# Patient Record
Sex: Female | Born: 1980 | Race: Black or African American | Hispanic: No | Marital: Married | Smoking: Never smoker
Health system: Southern US, Community
[De-identification: ages and names within clinical notes are randomized; demographics above are authoritative.]

## PROBLEM LIST (undated history)

## (undated) DIAGNOSIS — I10 Essential (primary) hypertension: Secondary | ICD-10-CM

## (undated) HISTORY — PX: TUBAL LIGATION: SHX77

---

## 2011-12-13 ENCOUNTER — Encounter (HOSPITAL_BASED_OUTPATIENT_CLINIC_OR_DEPARTMENT_OTHER): Payer: Self-pay | Admitting: *Deleted

## 2011-12-13 ENCOUNTER — Emergency Department (HOSPITAL_BASED_OUTPATIENT_CLINIC_OR_DEPARTMENT_OTHER): Payer: Worker's Compensation

## 2011-12-13 ENCOUNTER — Emergency Department (HOSPITAL_BASED_OUTPATIENT_CLINIC_OR_DEPARTMENT_OTHER)
Admission: EM | Admit: 2011-12-13 | Discharge: 2011-12-13 | Disposition: A | Discharge status: Home / Self Care | Payer: Worker's Compensation | Attending: Emergency Medicine | Admitting: Emergency Medicine

## 2011-12-13 DIAGNOSIS — W19XXXA Unspecified fall, initial encounter: Secondary | ICD-10-CM

## 2011-12-13 DIAGNOSIS — S20229A Contusion of unspecified back wall of thorax, initial encounter: Secondary | ICD-10-CM

## 2011-12-13 DIAGNOSIS — Y9269 Other specified industrial and construction area as the place of occurrence of the external cause: Secondary | ICD-10-CM | POA: Insufficient documentation

## 2011-12-13 LAB — URINALYSIS, ROUTINE W REFLEX MICROSCOPIC
Bilirubin Urine: NEGATIVE
Ketones, ur: NEGATIVE mg/dL
Nitrite: NEGATIVE
Protein, ur: NEGATIVE mg/dL
Urobilinogen, UA: 0.2 mg/dL (ref 0.0–1.0)

## 2011-12-13 MED ORDER — IBUPROFEN 800 MG PO TABS
800.0000 mg | ORAL_TABLET | Freq: Once | ORAL | Status: AC
  Administered 2011-12-13: 800 mg via ORAL
  Filled 2011-12-13: qty 1

## 2011-12-13 NOTE — ED Notes (Signed)
Pt amb to room 8 with quick steady gait smiling in nad. Pt reports fall at work yesterday, and states she has been awake all night with mid left sided back pain. Pt states she does not want worker's comp to cover this visit and she declines to phone her supervisor to ask if she needs a UDS and/or BAT.  Pt encouraged to phone her supervisor to inquire.

## 2011-12-13 NOTE — ED Provider Notes (Signed)
History     CSN: [id number]  Arrival date & time 12/13/11  0857   First MD Initiated Contact with Patient 12/13/11 [id number]      Chief Complaint  Patient presents with  . Fall  . Back Pain    (Consider location/radiation/quality/duration/timing/severity/associated sxs/prior treatment) HPI Pt presents with pain in left thoracic cage.  She states that she was at work yesterday and fell.  She states she was holding a large picture frame and fell on her left side onto some large picture frames that were leaning up against a table.  No difficulty breathing.  Pain is worse with movement and palpation.  She has tried tylenol without much relief.  No weakness of legs, no urinary retention, no incontinence of bowel or bladder.  There are no other associated systemic symptoms, there are no other alleviating or modifying factors.  Denies neck pain  History reviewed. No pertinent past medical history.  History reviewed. No pertinent past surgical history.  History reviewed. No pertinent family history.  History  Substance Use Topics  . Smoking status: Not on file  . Smokeless tobacco: Not on file  . Alcohol Use: Not on file    OB History    Grav Para Term Preterm Abortions TAB SAB Ect Mult Living                  Review of Systems ROS reviewed and all otherwise negative except for mentioned in HPI  Allergies  Codeine  Home Medications  No current outpatient prescriptions on file.  BP 130/94  Pulse 82  Temp 98.2 F (36.8 C) (Oral)  Resp 18  Ht 5\' 6"  (1.676 m)  Wt 220 lb (99.791 kg)  BMI 35.51 kg/m2  SpO2 100%  LMP 11/15/2011 Vitals reviewed Physical Exam Physical Examination: General appearance - alert, well appearing, and in no distress Mental status - alert, oriented to person, place, and time Neck - no midline tenderness, FROM without pain Chest - clear to auscultation, no wheezes, rales or rhonchi, symmetric air entry, ttp over left thoracic cage overlying midaxillary  line and posterior ribs, no crepitus Heart - normal rate, regular rhythm, normal S1, S2, no murmurs, rubs, clicks or gallops Abdomen - soft, nontender, nondistended, no masses or organomegaly Back exam - no midline tenderness to palpation, + left CVA tenderness Neurological - alert, oriented, normal speech, strength 5/5 in extremities x 4, sensation intact Musculoskeletal - no joint tenderness, deformity or swelling Extremities - peripheral pulses normal, no pedal edema, no clubbing or cyanosis Skin - normal coloration and turgor, no rashes, no bruising or laceration/abrasions overlying area of tenderness  ED Course  Procedures (including critical care time)   Labs Reviewed  URINALYSIS, ROUTINE W REFLEX MICROSCOPIC   Dg Ribs Unilateral W/chest Left  12/13/2011  *RADIOLOGY REPORT*  Clinical Data: Larey Seat.  Left rib pain.  LEFT RIBS AND CHEST - 3+ VIEW  Comparison: None  Findings: The cardiac silhouette, mediastinal and hilar contours are normal.  The lungs are clear.  No pleural effusion or pneumothorax.  Dedicated views of the left ribs demonstrate no definite acute left- sided rib fractures.  IMPRESSION:  1.  No acute cardiopulmonary findings. 2.  No definite acute left-sided rib fractures.   Original Report Authenticated By: P. Loralie Champagne, M.D.      1. Fall   2. Contusion of mid back       MDM  Pt presents with c/o left sided rib/back pain after fall at work yesterday.  xrays are reassuring, no blood in urine.  Pt advised to continue tylenol, add ibuprofen for muscle soreness.  Discharged with strict return precautions.  Pt agreeable with plan.        Ethelda Chick, MD 12/13/11 1106

## 2013-04-26 ENCOUNTER — Emergency Department (HOSPITAL_BASED_OUTPATIENT_CLINIC_OR_DEPARTMENT_OTHER)
Admission: EM | Admit: 2013-04-26 | Discharge: 2013-04-26 | Disposition: A | Discharge status: Home / Self Care | Payer: Medicaid Other | Attending: Emergency Medicine | Admitting: Emergency Medicine

## 2013-04-26 ENCOUNTER — Encounter (HOSPITAL_BASED_OUTPATIENT_CLINIC_OR_DEPARTMENT_OTHER): Payer: Self-pay | Admitting: Emergency Medicine

## 2013-04-26 DIAGNOSIS — R51 Headache: Secondary | ICD-10-CM | POA: Insufficient documentation

## 2013-04-26 DIAGNOSIS — R42 Dizziness and giddiness: Secondary | ICD-10-CM | POA: Insufficient documentation

## 2013-04-26 DIAGNOSIS — R519 Headache, unspecified: Secondary | ICD-10-CM

## 2013-04-26 HISTORY — DX: Essential (primary) hypertension: I10

## 2013-04-26 NOTE — ED Notes (Signed)
Woke up this morning with a headache, took her BP: 144/97, rechecked it later today and it was 140/100.  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

## 2013-04-26 NOTE — ED Provider Notes (Signed)
CSN: [id number]     Arrival date & time 04/26/13  1911 History   First MD Initiated Contact with Patient 04/26/13 1935     Chief Complaint  Patient presents with  . Hypertension   (Consider location/radiation/quality/duration/timing/severity/associated sxs/prior Treatment) Patient is a 33 y.o. female presenting with hypertension. The history is provided by the patient. No language interpreter was used.  Hypertension This is a new problem. Associated symptoms include congestion and headaches. Pertinent negatives include no abdominal pain, chest pain, chills, coughing or fever. Associated symptoms comments: She has had a headache daily for the past 2 weeks associated with some mild lightheadedness. No syncope, chest pain, SOB, cough or fever. She has had some nasal and sinus congestion and intermittent sore throat as well. She became concerned about the headache and a friend checked her blood pressure and, finding it elevated, suggested she come to ED for evaluation. .    Past Medical History  Diagnosis Date  . Hypertension    Past Surgical History  Procedure Laterality Date  . Tubal ligation     No family history on file. History  Substance Use Topics  . Smoking status: Never Smoker   . Smokeless tobacco: Not on file  . Alcohol Use: Yes   OB History   Grav Para Term Preterm Abortions TAB SAB Ect Mult Living                 Review of Systems  Constitutional: Negative for fever and chills.  HENT: Positive for congestion and sinus pressure.   Respiratory: Negative.  Negative for cough and shortness of breath.   Cardiovascular: Negative.  Negative for chest pain.  Gastrointestinal: Negative.  Negative for abdominal pain.  Musculoskeletal: Negative.   Skin: Negative.   Neurological: Positive for light-headedness and headaches.    Allergies  Codeine  Home Medications  No current outpatient prescriptions on file. BP 137/100  Pulse 69  Temp(Src) 98.7 F (37.1 C) (Oral)  Ht  5\' 6"  (1.676 m)  Wt 211 lb (95.709 kg)  BMI 34.07 kg/m2  SpO2 100%  LMP 04/15/2013 Physical Exam  Constitutional: She is oriented to person, place, and time. She appears well-developed and well-nourished.  HENT:  Head: Normocephalic.  Right Ear: External ear normal.  Left Ear: External ear normal.  Nose: Mucosal edema present. Right sinus exhibits frontal sinus tenderness. Left sinus exhibits frontal sinus tenderness.  Mouth/Throat: Oropharynx is clear and moist.  Neck: Normal range of motion. Neck supple.  Cardiovascular: Normal rate and normal heart sounds.   No murmur heard. Pulmonary/Chest: Effort normal and breath sounds normal. She has no wheezes. She has no rales.  Abdominal: Soft. Bowel sounds are normal. She exhibits no distension. There is no tenderness.  Musculoskeletal: Normal range of motion. She exhibits no edema.  Lymphadenopathy:    She has no cervical adenopathy.  Neurological: She is alert and oriented to person, place, and time.  Skin: Skin is warm and dry. No pallor.    ED Course  Procedures (including critical care time) Labs Review Labs Reviewed - No data to display Imaging Review No results found.  EKG Interpretation   None       MDM  No diagnosis found. 1. Sinus headache 2. Mild hypertension  Suspect the headache is secondary to mild sinus pressure. Discussed follow up with PCP for recheck of blood pressure.    Arnoldo HookerShari A Libero Puthoff, PA-C 04/26/13 2015

## 2013-04-26 NOTE — ED Provider Notes (Signed)
Medical screening examination/treatment/procedure(s) were performed by non-physician practitioner and as supervising physician I was immediately available for consultation/collaboration.  EKG Interpretation   None         Elizabeth B. Bernette MayersSheldon, MD 04/26/13 2016

## 2013-04-26 NOTE — Discharge Instructions (Signed)
RECOMMEND PLAIN SALINE NASAL SPRAYS, IBUPROFEN AND ZYRTEC FOR SYMPTOMS OF SINUS PRESSURE HEADACHE. FOLLOW UP WITH A PRIMARY CARE PROVIDER TO HAVE YOUR BLOOD PRESSURE RECHECKED.   Sinus Headache A sinus headache is when your sinuses become clogged or swollen. Sinus headaches can range from mild to severe.  CAUSES A sinus headache can have different causes, such as:  Colds.  Sinus infections.  Allergies. SYMPTOMS  Symptoms of a sinus headache may vary and can include:  Headache.  Pain or pressure in the face.  Congested or runny nose.  Fever.  Inability to smell.  Pain in upper teeth. Weather changes can make symptoms worse. TREATMENT  The treatment of a sinus headache depends on the cause.  Sinus pain caused by a sinus infection may be treated with antibiotic medicine.  Sinus pain caused by allergies may be helped by allergy medicines (antihistamines) and medicated nasal sprays.  Sinus pain caused by congestion may be helped by flushing the nose and sinuses with saline solution. HOME CARE INSTRUCTIONS   If antibiotics are prescribed, take them as directed. Finish them even if you start to feel better.  Only take over-the-counter or prescription medicines for pain, discomfort, or fever as directed by your caregiver.  If you have congestion, use a nasal spray to help reduce pressure. SEEK IMMEDIATE MEDICAL CARE IF:  You have a fever.  You have headaches more than once a week.  You have sensitivity to light or sound.  You have repeated nausea and vomiting.  You have vision problems.  You have sudden, severe pain in your face or head.  You have a seizure.  You are confused.  Your sinus headaches do not get better after treatment. Many people think they have a sinus headache when they actually have migraines or tension headaches. MAKE SURE YOU:   Understand these instructions.  Will watch your condition.  Will get help right away if you are not doing well  or get worse. Document Released: 04/20/2004 Document Revised: 06/05/2011 Document Reviewed: 06/11/2010 [facility] Patient Information 2014 [place], [place].

## 2016-12-13 ENCOUNTER — Emergency Department (HOSPITAL_BASED_OUTPATIENT_CLINIC_OR_DEPARTMENT_OTHER)
Admission: EM | Admit: 2016-12-13 | Discharge: 2016-12-13 | Disposition: A | Discharge status: Home / Self Care | Payer: Self-pay | Attending: Emergency Medicine | Admitting: Emergency Medicine

## 2016-12-13 ENCOUNTER — Encounter (HOSPITAL_BASED_OUTPATIENT_CLINIC_OR_DEPARTMENT_OTHER): Payer: Self-pay | Admitting: Emergency Medicine

## 2016-12-13 ENCOUNTER — Emergency Department (HOSPITAL_BASED_OUTPATIENT_CLINIC_OR_DEPARTMENT_OTHER): Payer: Self-pay

## 2016-12-13 DIAGNOSIS — Y9389 Activity, other specified: Secondary | ICD-10-CM | POA: Insufficient documentation

## 2016-12-13 DIAGNOSIS — I10 Essential (primary) hypertension: Secondary | ICD-10-CM | POA: Insufficient documentation

## 2016-12-13 DIAGNOSIS — Y999 Unspecified external cause status: Secondary | ICD-10-CM | POA: Insufficient documentation

## 2016-12-13 DIAGNOSIS — W228XXA Striking against or struck by other objects, initial encounter: Secondary | ICD-10-CM | POA: Insufficient documentation

## 2016-12-13 DIAGNOSIS — S4991XA Unspecified injury of right shoulder and upper arm, initial encounter: Secondary | ICD-10-CM | POA: Insufficient documentation

## 2016-12-13 DIAGNOSIS — Y92512 Supermarket, store or market as the place of occurrence of the external cause: Secondary | ICD-10-CM | POA: Insufficient documentation

## 2016-12-13 NOTE — ED Triage Notes (Signed)
Reports at [employer] earlier and got hit with loading pallet.  Reports pain to left arm.  States that this has been tingling since event occurred at 1030 this morning.

## 2016-12-13 NOTE — Discharge Instructions (Signed)
It was nice to meet you today! Sorry about your arm hurting. Please use ice to help with any swelling. You can use tylenol or ibuprofen as needed for any discomfort. Please return to be seen if swelling or numbness/tingling worsens in your arm.

## 2016-12-13 NOTE — ED Provider Notes (Signed)
[facility]   CSN: [id number] Arrival date & time: 12/13/16  1528     History   Chief Complaint Chief Complaint  Patient presents with  . Arm Injury    HPI Elizabeth Nunez is a 36 y.o. female presenting after arm injury earlier today with persistent arm pain and numbness/tingling in her hand. She was food shopping and a worker ran into her arm with a palate. She returned to work and felt her arm was swollen. She had some tingling in her fingertips and back of her hand. She denies muscle weakness. No abrasions or bruises that she has noted. No joint pain. She states her arm feels tight.   HPI  Past Medical History:  Diagnosis Date  . Hypertension     There are no active problems to display for this patient.   Past Surgical History:  Procedure Laterality Date  . TUBAL LIGATION      OB History    No data available       Home Medications    Prior to Admission medications   Not on File    Family History History reviewed. No pertinent family history.  Social History Social History  Substance Use Topics  . Smoking status: Never Smoker  . Smokeless tobacco: Never Used  . Alcohol use Yes     Allergies   Codeine   Review of Systems Review of Systems  Constitutional: Negative for chills and fever.  HENT: Negative.   Eyes: Negative.   Respiratory: Negative.   Cardiovascular: Negative.   Gastrointestinal: Negative.   Genitourinary: Negative.   Musculoskeletal: Negative for arthralgias, gait problem, joint swelling, myalgias, neck pain and neck stiffness.  Skin: Negative for rash and wound.  Neurological: Negative for dizziness, syncope, weakness, light-headedness and headaches.     Physical Exam Updated Vital Signs BP 120/85 (BP Location: Right Arm)   Pulse 79   Temp 98.9 F (37.2 C) (Oral)   Resp 18   Ht  (1.676 m)   Wt 101.6 kg (224 lb)   LMP 12/13/2016 (Exact Date)   SpO2 99%   BMI 36.15 kg/m   Physical  Exam  Constitutional: She is oriented to person, place, and time. She appears well-developed and well-nourished. No distress.  HENT:  Head: Normocephalic and atraumatic.  Eyes: EOM are normal.  Neck: Normal range of motion. Neck supple.  Cardiovascular: Normal rate, regular rhythm and intact distal pulses.   Pulmonary/Chest: Effort normal and breath sounds normal.  Abdominal: Soft. She exhibits no distension. There is no tenderness.  Musculoskeletal: Normal range of motion. She exhibits no edema or deformity.       Left elbow: Tenderness found.  Full ROM at elbow joint bilaterally, strength 5/5 in upper extremities bilaterally. No edema or cyanosis noted. Neurovascularly in tact.   Neurological: She is alert and oriented to person, place, and time. She exhibits normal muscle tone.  Skin: Skin is warm. No rash noted.     ED Treatments / Results  Labs (all labs ordered are listed, but only abnormal results are displayed) Labs Reviewed - No data to display  EKG  EKG Interpretation None       Radiology Dg Elbow Complete Left  Result Date: 12/13/2016 CLINICAL DATA:  Left elbow injury. Hit with palate and grocery store. EXAM: LEFT ELBOW - COMPLETE 3+ VIEW COMPARISON:  None. FINDINGS: There is no evidence of fracture, dislocation, or joint effusion. There is no evidence of arthropathy or other focal  bone abnormality. Soft tissues are unremarkable. IMPRESSION: Negative. Electronically Signed   By: Signa Kell M.D.   On: 12/13/2016 16:17   Dg Forearm Left  Result Date: 12/13/2016 CLINICAL DATA:  Left forearm injury today. Numbness with tingling, pain and swelling. EXAM: LEFT FOREARM - 2 VIEW COMPARISON:  None. FINDINGS: The mineralization and alignment are normal. There is no evidence of acute fracture or dislocation. The joint spaces are maintained. No focal soft tissue swelling identified. IMPRESSION: No acute osseous findings identified. Electronically Signed   By: Carey Bullocks  M.D.   On: 12/13/2016 16:17    Procedures Procedures (including critical care time)  Medications Ordered in ED Medications - No data to display   Initial Impression / Assessment and Plan / ED Course  I have reviewed the triage vital signs and the nursing notes.  Pertinent labs & imaging results that were available during my care of the patient were reviewed by me and considered in my medical decision making (see chart for details).     Well appearing 36 year old female with pain in left upper extremity after minor injury. Extremity without abrasion or wound, no swelling appreciated, normal strength and sensation, radial pulse +2. X-ray of arm negative for acute fracture, no soft tissue swelling. Stable for discharge home. Advised tylenol or ice pack for pain relief. Follow up with PCP if pain or swelling worsens. Patient verbalized understanding and agreement with plan.   Final Clinical Impressions(s) / ED Diagnoses   Final diagnoses:  Soft tissue injury of right upper arm, initial encounter    New Prescriptions There are no discharge medications for this patient.    Tillman Sers, DO 12/13/16 1750    Rolland Porter, MD 12/26/16 2025

## 2017-05-21 ENCOUNTER — Emergency Department (HOSPITAL_BASED_OUTPATIENT_CLINIC_OR_DEPARTMENT_OTHER)
Admission: EM | Admit: 2017-05-21 | Discharge: 2017-05-21 | Disposition: A | Discharge status: Home / Self Care | Payer: Self-pay | Attending: Physician Assistant | Admitting: Physician Assistant

## 2017-05-21 ENCOUNTER — Other Ambulatory Visit: Payer: Self-pay

## 2017-05-21 ENCOUNTER — Encounter (HOSPITAL_BASED_OUTPATIENT_CLINIC_OR_DEPARTMENT_OTHER): Payer: Self-pay | Admitting: *Deleted

## 2017-05-21 DIAGNOSIS — I10 Essential (primary) hypertension: Secondary | ICD-10-CM | POA: Insufficient documentation

## 2017-05-21 DIAGNOSIS — R69 Illness, unspecified: Secondary | ICD-10-CM

## 2017-05-21 DIAGNOSIS — J111 Influenza due to unidentified influenza virus with other respiratory manifestations: Secondary | ICD-10-CM | POA: Insufficient documentation

## 2017-05-21 MED ORDER — OSELTAMIVIR PHOSPHATE 75 MG PO CAPS: 75.0000 mg | ORAL_CAPSULE | Freq: Two times a day (BID) | ORAL | 0 refills | Status: AC

## 2017-05-21 MED ORDER — ACETAMINOPHEN 325 MG PO TABS
650.0000 mg | ORAL_TABLET | Freq: Once | ORAL | Status: AC
  Administered 2017-05-21: 650 mg via ORAL
  Filled 2017-05-21: qty 2

## 2017-05-21 MED ORDER — ACETAMINOPHEN 325 MG PO TABS
ORAL_TABLET | ORAL | Status: AC
  Filled 2017-05-21: qty 1

## 2017-05-21 NOTE — Discharge Instructions (Signed)
Please take Ibuprofen (Advil, motrin) and Tylenol (acetaminophen) to relieve your pain.  You may take up to 600 MG (3 pills) of normal strength ibuprofen every 8 hours as needed.  In between doses of ibuprofen you make take tylenol, up to 1,000 mg (two extra strength pills).  Do not take more than 3,000 mg tylenol in a 24 hour period.  Please check all medication labels as many medications such as pain and cold medications may contain tylenol.  Do not drink alcohol while taking these medications.  Do not take other NSAID'S while taking ibuprofen (such as aleve or naproxen).  Please take ibuprofen with food to decrease stomach upset.  Please make sure you are staying well hydrated, if you develop shortness of breath, chest pain, worsening symptoms, burning or stinging when you pee, nausea vomiting diarrhea, or have any other concerns please seek additional medical care and evaluation.  You may return to work once you have been symptom-free, without the use of ibuprofen or Tylenol, for 24 hours.

## 2017-05-21 NOTE — ED Provider Notes (Signed)
[facility] EMERGENCY DEPARTMENT Provider Note   CSN: [id number] Arrival date & time: 05/21/17  1130     History   Chief Complaint Chief Complaint  Patient presents with  . Fever  . Cough    HPI Elizabeth Nunez is a 37 y.o. female history of hypertension who presents today for evaluation of fever, chills, body aches, and cough since Saturday.  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  She denies any nausea vomiting diarrhea, is still able to hydrate well.  She has been taking Tylenol at home as needed for her fever however once the Tylenol wears off she notes that her fever comes back.  She did not get the flu shot this year.  HPI  Past Medical History:  Diagnosis Date  . Hypertension     There are no active problems to display for this patient.   Past Surgical History:  Procedure Laterality Date  . TUBAL LIGATION      OB History    No data available       Home Medications    Prior to Admission medications   Medication Sig Start Date End Date Taking? Authorizing Provider  oseltamivir (TAMIFLU) 75 MG capsule Take 1 capsule (75 mg total) by mouth every 12 (twelve) hours. 05/21/17   Cristina Gong, PA-C    Family History No family history on file.  Social History Social History   Tobacco Use  . Smoking status: Never Smoker  . Smokeless tobacco: Never Used  Substance Use Topics  . Alcohol use: Yes  . Drug use: No     Allergies   Codeine   Review of Systems Review of Systems  Constitutional: Positive for chills and fever.  HENT: Positive for congestion, ear pain, postnasal drip and sore throat. Negative for rhinorrhea.   Eyes: Negative for visual disturbance.  Respiratory: Positive for cough. Negative for shortness of breath.   Cardiovascular: Negative for chest pain.  Gastrointestinal: Negative for diarrhea, nausea  and vomiting.  Genitourinary: Negative for dysuria, frequency and urgency.  Musculoskeletal: Positive for arthralgias and myalgias.  Skin: Negative for rash.  Allergic/Immunologic: Negative for immunocompromised state.  Neurological: Positive for headaches.  All other systems reviewed and are negative.    Physical Exam Updated Vital Signs BP (!) 133/93   Pulse (!) 110   Temp (!) 100.9 F (38.3 C) (Oral)   Resp 20   Ht 5\' 6"  (1.676 m)   Wt 107 kg (236 lb)   LMP 05/07/2017   SpO2 99%   BMI 38.09 kg/m   Physical Exam  Constitutional: She appears well-developed and well-nourished.  HENT:  Head: Normocephalic and atraumatic.  Right Ear: Tympanic membrane, external ear and ear canal normal.  Left Ear: Tympanic membrane, external ear and ear canal normal.  Nose: Nose normal.  Mouth/Throat: Uvula is midline, oropharynx is clear and moist and mucous membranes are normal. No posterior oropharyngeal edema or posterior oropharyngeal erythema. No tonsillar exudate.  Eyes: Conjunctivae are normal.  Neck: Normal range of motion and full passive range of motion without pain. Neck supple.  Cardiovascular: Normal rate, regular rhythm, normal heart sounds and intact distal pulses.  No murmur heard. Pulmonary/Chest: Effort normal and breath sounds normal. No respiratory distress. She has no wheezes. She has no rales. She exhibits no tenderness.  Abdominal: Soft. She exhibits no distension. There is no  tenderness.  Neurological: She is alert.  Skin: Skin is warm and dry. She is not diaphoretic.  Nursing note and vitals reviewed.    ED Treatments / Results  Labs (all labs ordered are listed, but only abnormal results are displayed) Labs Reviewed - No data to display  EKG  EKG Interpretation None       Radiology No results found.  Procedures Procedures (including critical care time)  Medications Ordered in ED Medications  acetaminophen (TYLENOL) tablet 650 mg (650 mg Oral  Given 05/21/17 1206)     Initial Impression / Assessment and Plan / ED Course  I have reviewed the triage vital signs and the nursing notes.  Pertinent labs & imaging results that were available during my care of the patient were reviewed by me and considered in my medical decision making (see chart for details).    Patient with symptoms consistent with influenza.  Vitals are stable, low-grade fever.  No signs of dehydration, tolerating PO's.  Lungs are clear. Due to patient's presentation and physical exam a chest x-ray was not ordered bc likely diagnosis of flu.  Discussed the cost versus benefit of Tamiflu treatment with the patient.  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  This was discussed with her and she voiced understanding.  Patient will be discharged with instructions to orally hydrate, rest, and use over-the-counter medications such as anti-inflammatories ibuprofen and Aleve for muscle aches and Tylenol for fever.  Return precautions discussed and she voiced understanding.  Role of chest x-ray was discussed with patient who declined chest x-ray at this time.   Final Clinical Impressions(s) / ED Diagnoses   Final diagnoses:  Influenza-like illness    ED Discharge Orders        Ordered    oseltamivir (TAMIFLU) 75 MG capsule  Every 12 hours     05/21/17 1245       Cristina GongHammond, Am[place] Bartel W, [place] JerseyPA-C 05/21/17 1303    Abelino DerrickMackuen, Courteney Lyn, MD 05/21/17 1547

## 2017-05-21 NOTE — ED Triage Notes (Signed)
Fever, chills, body aches and cough since last night.

## 2018-04-25 ENCOUNTER — Emergency Department (HOSPITAL_BASED_OUTPATIENT_CLINIC_OR_DEPARTMENT_OTHER)
Admission: EM | Admit: 2018-04-25 | Discharge: 2018-04-25 | Disposition: A | Discharge status: Home / Self Care | Payer: Self-pay | Attending: Emergency Medicine | Admitting: Emergency Medicine

## 2018-04-25 ENCOUNTER — Encounter (HOSPITAL_BASED_OUTPATIENT_CLINIC_OR_DEPARTMENT_OTHER): Payer: Self-pay | Admitting: Emergency Medicine

## 2018-04-25 ENCOUNTER — Other Ambulatory Visit: Payer: Self-pay

## 2018-04-25 ENCOUNTER — Emergency Department (HOSPITAL_BASED_OUTPATIENT_CLINIC_OR_DEPARTMENT_OTHER): Payer: Self-pay

## 2018-04-25 DIAGNOSIS — B9789 Other viral agents as the cause of diseases classified elsewhere: Secondary | ICD-10-CM | POA: Insufficient documentation

## 2018-04-25 DIAGNOSIS — I10 Essential (primary) hypertension: Secondary | ICD-10-CM | POA: Insufficient documentation

## 2018-04-25 DIAGNOSIS — J069 Acute upper respiratory infection, unspecified: Secondary | ICD-10-CM | POA: Insufficient documentation

## 2018-04-25 LAB — PREGNANCY, URINE: PREG TEST UR: NEGATIVE

## 2018-04-25 MED ORDER — BENZONATATE 100 MG PO CAPS
200.0000 mg | ORAL_CAPSULE | Freq: Two times a day (BID) | ORAL | 0 refills | Status: AC | PRN
Start: 2018-04-25 — End: ?

## 2018-04-25 NOTE — ED Provider Notes (Signed)
[facility] EMERGENCY DEPARTMENT Provider Note   CSN: [id number] Arrival date & time: 04/25/18  [id number]     History   Chief Complaint Chief Complaint  Patient presents with  . Cough    HPI Elizabeth Nunez is a 38 y.o. female.  38yo F w/ PMH including HTN who p/w cough and congestion. A [place] over a week ago, she began having cough associated w/ sore throat, congestion, sinus pressure, feeling sluggish and feeling nauseated with decreased appetite. She reports the cough has gotten worse instead of better. She has been taking OTC cold and flu as well as cough syrup and cough drops without much relief. No sick contacts. No known fevers. She denies any complaints of pain.   The history is provided by the patient.    Past Medical History:  Diagnosis Date  . Hypertension     There are no active problems to display for this patient.   Past Surgical History:  Procedure Laterality Date  . TUBAL LIGATION       OB History   No obstetric history on file.      Home Medications    Prior to Admission medications   Medication Sig Start Date End Date Taking? Authorizing Provider  benzonatate (TESSALON) 100 MG capsule Take 2 capsules (200 mg total) by mouth 2 (two) times daily as needed for cough. 04/25/18   Jontue Crumpacker, Ambrose Finlandachel Morgan, MD  oseltamivir (TAMIFLU) 75 MG capsule Take 1 capsule (75 mg total) by mouth every 12 (twelve) hours. 05/21/17   Cristina GongHammond, Elizabeth W, PA-C    Family History No family history on file.  Social History Social History   Tobacco Use  . Smoking status: Never Smoker  . Smokeless tobacco: Never Used  Substance Use Topics  . Alcohol use: Yes  . Drug use: No     Allergies   Codeine   Review of Systems Review of Systems All other systems reviewed and are negative except that which was mentioned in HPI   Physical Exam Updated Vital Signs BP (!) 148/100 (BP Location: Right Arm)   Pulse 95   Temp 98.5 F (36.9 C) (Oral)   Resp 16    LMP 04/06/2018   SpO2 100%   Physical Exam Vitals signs and nursing note reviewed.  Constitutional:      General: She is not in acute distress.    Appearance: She is well-developed.  HENT:     Head: Normocephalic and atraumatic.     Nose: No rhinorrhea.     Mouth/Throat:     Mouth: Mucous membranes are moist.     Pharynx: Oropharynx is clear. No oropharyngeal exudate.  Eyes:     Conjunctiva/sclera: Conjunctivae normal.  Neck:     Musculoskeletal: Neck supple.  Cardiovascular:     Rate and Rhythm: Normal rate and regular rhythm.     Heart sounds: Normal heart sounds. No murmur.  Pulmonary:     Effort: Pulmonary effort is normal.     Breath sounds: Normal breath sounds. No wheezing.  Abdominal:     General: Bowel sounds are normal. There is no distension.     Palpations: Abdomen is soft.     Tenderness: There is no abdominal tenderness.  Musculoskeletal:     Right lower leg: No edema.     Left lower leg: No edema.  Skin:    General: Skin is warm and dry.  Neurological:     Mental Status: She is alert and oriented to person, place,  and time.     Comments: Fluent speech  Psychiatric:        Judgment: Judgment normal.      ED Treatments / Results  Labs (all labs ordered are listed, but only abnormal results are displayed) Labs Reviewed  PREGNANCY, URINE    EKG None  Radiology Dg Chest 2 View  Result Date: 04/25/2018 CLINICAL DATA:  Worsening cough for 1 week. EXAM: CHEST - 2 VIEW COMPARISON:  12/13/2011 FINDINGS: The heart size and mediastinal contours are within normal limits. Both lungs are clear. The visualized skeletal structures are unremarkable. IMPRESSION: No active cardiopulmonary disease. Electronically Signed   By: Elige KoHetal  Patel   On: 04/25/2018 10:16    Procedures Procedures (including critical care time)  Medications Ordered in ED Medications - No data to display   Initial Impression / Assessment and Plan / ED Course  I have reviewed the  triage vital signs and the nursing notes.  Pertinent labs & imaging results that were available during my care of the patient were reviewed by me and considered in my medical decision making (see chart for details).    PT comfortable on exam, reassuring VS, afebrile, clear breath sounds. CXR clear. Suspect viral URI. Discussed expected course and supportive measures.  Extensively reviewed return precautions and she voiced understanding.  Final Clinical Impressions(s) / ED Diagnoses   Final diagnoses:  Viral URI with cough    ED Discharge Orders         Ordered    benzonatate (TESSALON) 100 MG capsule  2 times daily PRN     04/25/18 1122           Timisha Mondry, Ambrose Finlandachel Morgan, MD 04/25/18 1135

## 2018-04-25 NOTE — ED Triage Notes (Signed)
Pt reports her symptoms started last Tuesday with dry cough , then sinus pressure , sore throat , body-aches, chills. denies chest pain yet some shortness of breath.
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
## 2020-05-29 IMAGING — CR DG CHEST 2V
2 series · 2 of 2 positions shown · non-contrast
Comparison: 12/13/2011

CLINICAL DATA: Worsening cough for 1 week.

EXAM:
CHEST - 2 VIEW

[w chest pa]
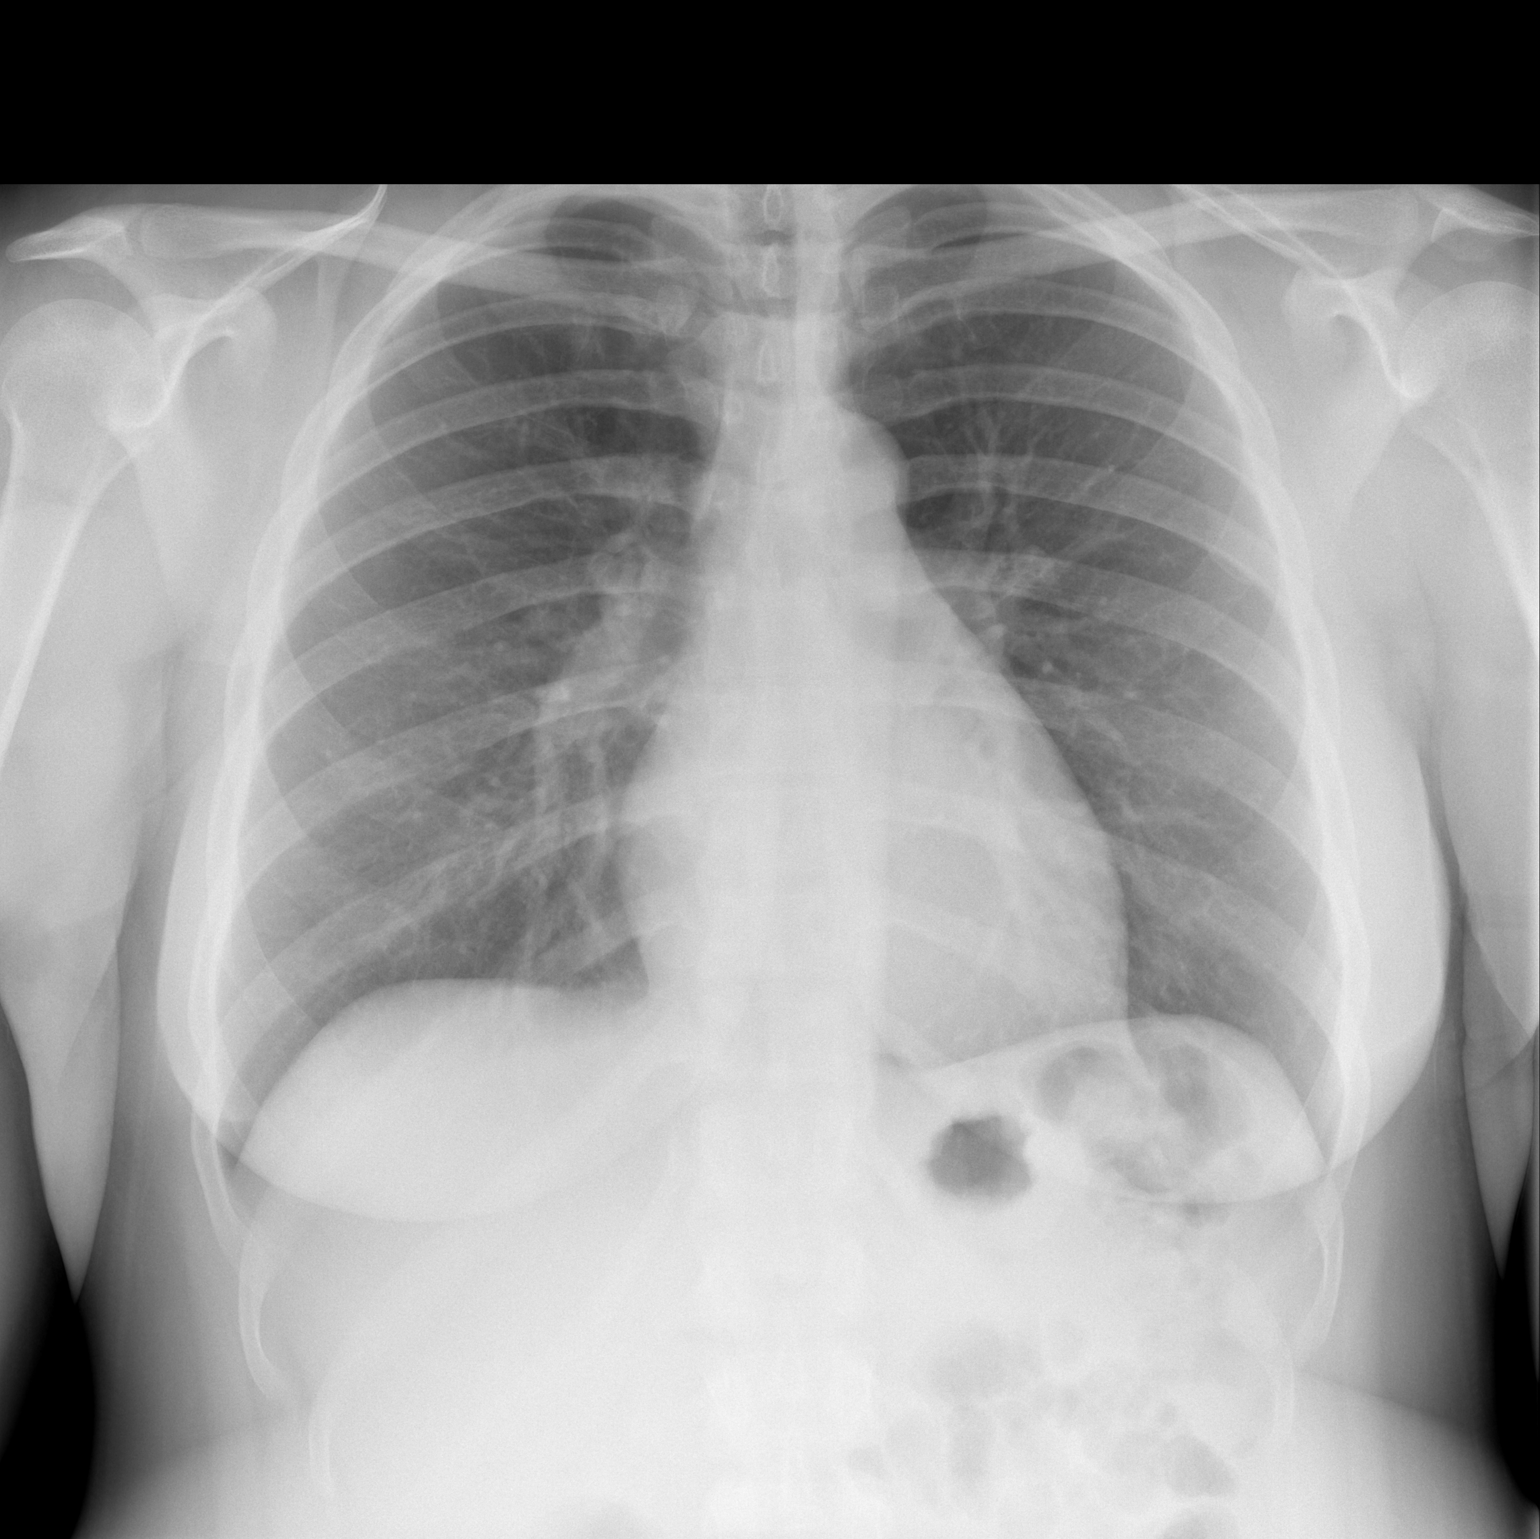

[w chest lat]
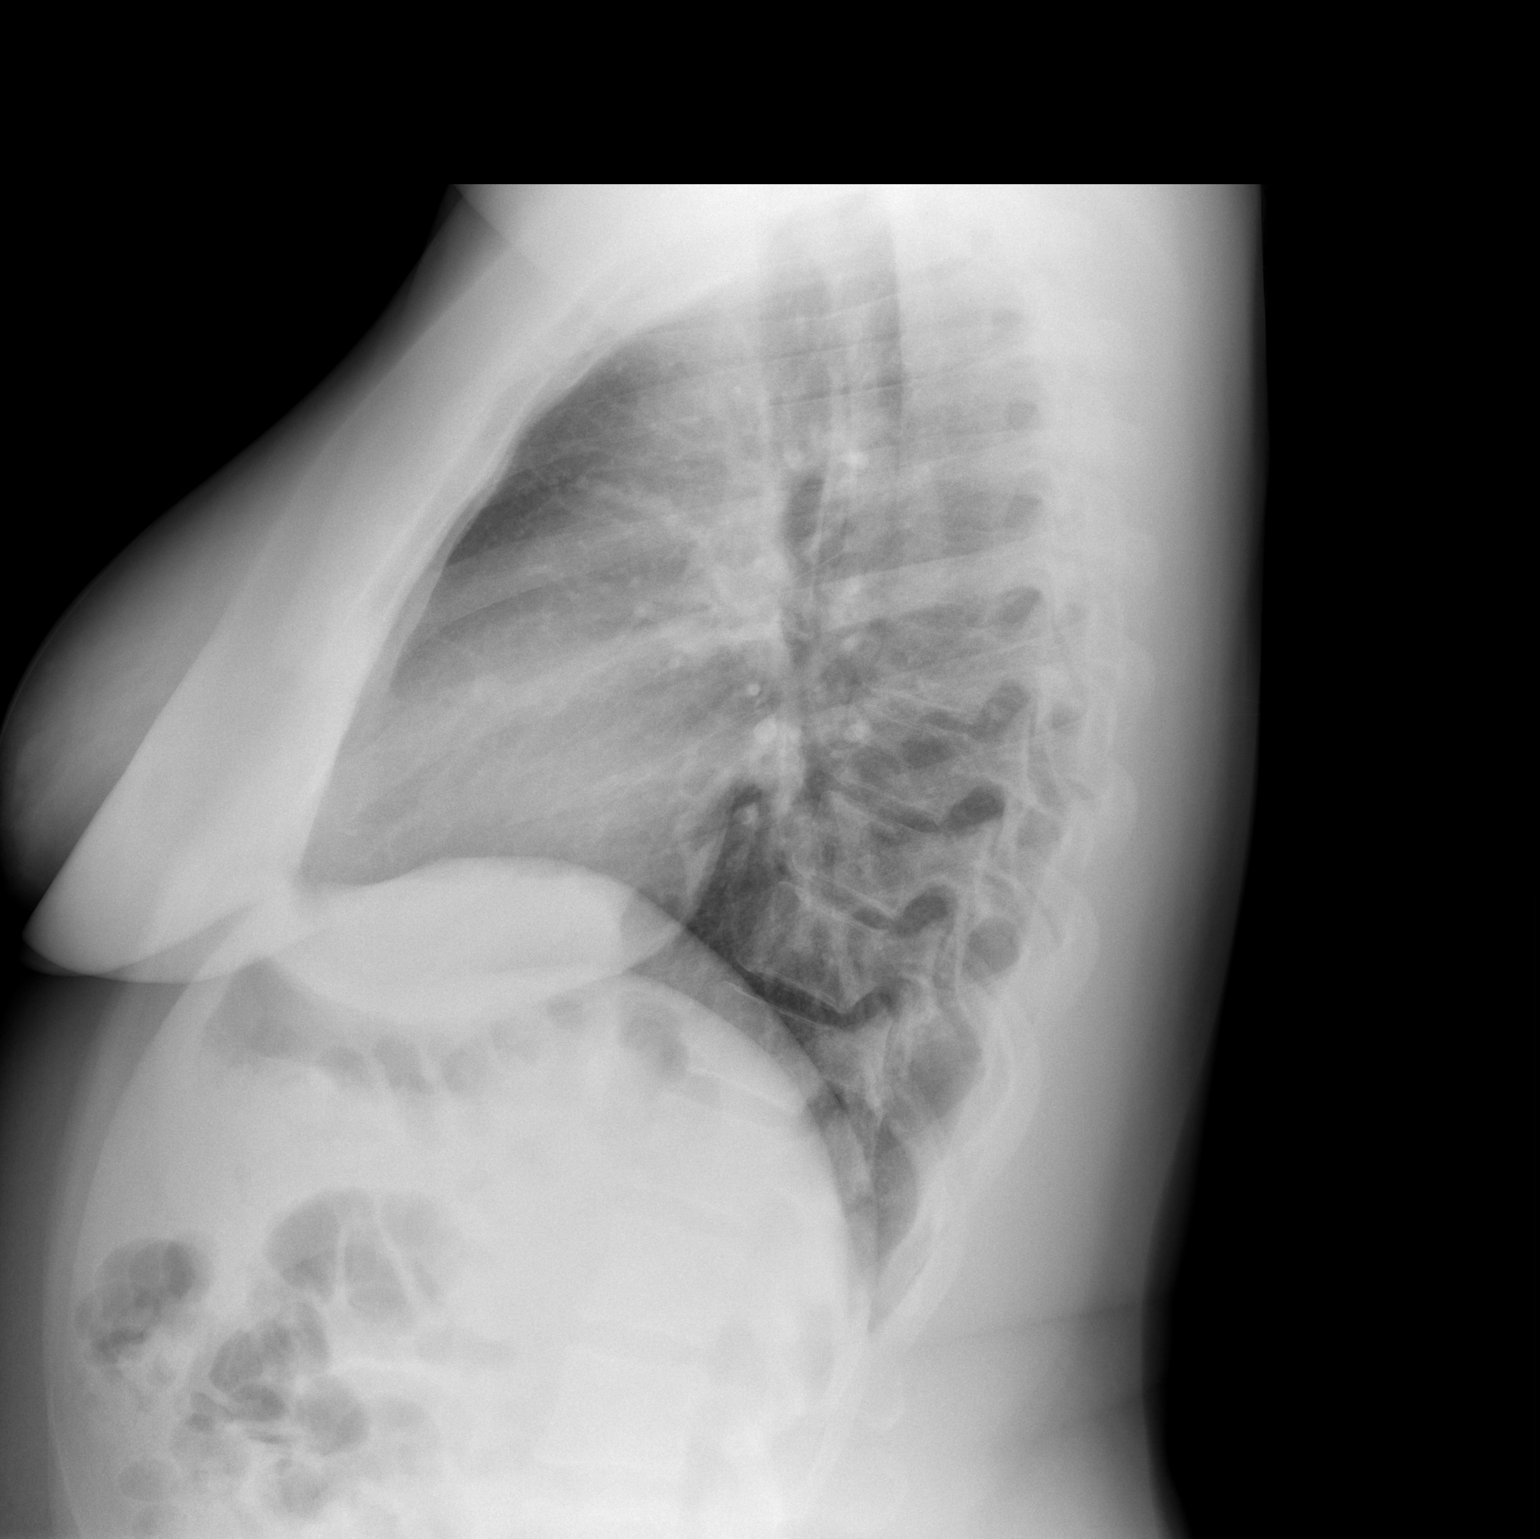

[2 of 2 positions shown; findings below may reference images not displayed]

FINDINGS: The heart size and mediastinal contours are within normal limits.
Both lungs are clear. The visualized skeletal structures are
unremarkable.
IMPRESSION: No active cardiopulmonary disease.
